# Patient Record
Sex: Male | Born: 1935 | Race: White | Hispanic: No | Marital: Single | Smoking: Former smoker
Health system: Southern US, Community
[De-identification: ages and names within clinical notes are randomized; demographics above are authoritative.]

---

## 2000-08-28 ENCOUNTER — Encounter: Payer: Self-pay | Admitting: Ophthalmology

## 2000-08-28 ENCOUNTER — Ambulatory Visit (HOSPITAL_COMMUNITY): Admission: RE | Admit: 2000-08-28 | Discharge: 2000-08-28 | Payer: Self-pay | Admitting: Ophthalmology

## 2004-10-15 ENCOUNTER — Ambulatory Visit: Admission: RE | Admit: 2004-10-15 | Discharge: 2004-12-30 | Payer: Self-pay | Admitting: *Deleted

## 2005-01-25 ENCOUNTER — Ambulatory Visit: Admission: RE | Admit: 2005-01-25 | Discharge: 2005-01-25 | Payer: Self-pay | Admitting: *Deleted

## 2010-04-14 ENCOUNTER — Inpatient Hospital Stay (HOSPITAL_COMMUNITY): Admission: RE | Admit: 2010-04-14 | Discharge: 2010-04-16 | Payer: Self-pay | Admitting: Orthopedic Surgery

## 2010-12-04 LAB — CBC
HCT: 30.4 % — ABNORMAL LOW (ref 39.0–52.0)
HCT: 31.8 % — ABNORMAL LOW (ref 39.0–52.0)
HCT: 40.5 % (ref 39.0–52.0)
Hemoglobin: 13.7 g/dL (ref 13.0–17.0)
MCH: 30.1 pg (ref 26.0–34.0)
MCH: 30.3 pg (ref 26.0–34.0)
MCHC: 34 g/dL (ref 30.0–36.0)
MCHC: 34.3 g/dL (ref 30.0–36.0)
MCHC: 34.4 g/dL (ref 30.0–36.0)
MCV: 87.8 fL (ref 78.0–100.0)
MCV: 88 fL (ref 78.0–100.0)
MCV: 88.8 fL (ref 78.0–100.0)
Platelets: 130 10*3/uL — ABNORMAL LOW (ref 150–400)
RBC: 4.56 MIL/uL (ref 4.22–5.81)
RDW: 14.4 % (ref 11.5–15.5)
RDW: 14.5 % (ref 11.5–15.5)
RDW: 14.7 % (ref 11.5–15.5)
WBC: 8.3 10*3/uL (ref 4.0–10.5)

## 2010-12-04 LAB — BASIC METABOLIC PANEL
BUN: 12 mg/dL (ref 6–23)
BUN: 9 mg/dL (ref 6–23)
CO2: 26 mEq/L (ref 19–32)
Calcium: 8.5 mg/dL (ref 8.4–10.5)
Chloride: 102 mEq/L (ref 96–112)
Chloride: 104 mEq/L (ref 96–112)
Creatinine, Ser: 0.99 mg/dL (ref 0.4–1.5)
GFR calc Af Amer: >60 mL/min (ref >60)
GFR calc non Af Amer: >60 mL/min (ref >60)
Glucose, Bld: 112 mg/dL — ABNORMAL HIGH (ref 70–99)
Glucose, Bld: 151 mg/dL — ABNORMAL HIGH (ref 70–99)
Potassium: 4 mEq/L (ref 3.5–5.1)
Potassium: 4.3 mEq/L (ref 3.5–5.1)
Sodium: 141 mEq/L (ref 135–145)

## 2010-12-04 LAB — TYPE AND SCREEN: Antibody Screen: NEGATIVE

## 2010-12-04 LAB — DIFFERENTIAL
Basophils Absolute: 0 10*3/uL (ref 0.0–0.1)
Basophils Relative: 0 % (ref 0–1)
Eosinophils Absolute: 0.1 10*3/uL (ref 0.0–0.7)
Eosinophils Relative: 1 % (ref 0–5)
Lymphocytes Relative: 15 % (ref 12–46)
Lymphs Abs: 1.2 10*3/uL (ref 0.7–4.0)
Monocytes Absolute: 0.5 10*3/uL (ref 0.1–1.0)
Monocytes Relative: 6 % (ref 3–12)
Neutro Abs: 6.4 10*3/uL (ref 1.7–7.7)
Neutrophils Relative %: 78 % — ABNORMAL HIGH (ref 43–77)

## 2010-12-04 LAB — URINALYSIS, ROUTINE W REFLEX MICROSCOPIC
Glucose, UA: NEGATIVE mg/dL
Hgb urine dipstick: NEGATIVE
Nitrite: NEGATIVE
Specific Gravity, Urine: 1.034 — ABNORMAL HIGH (ref 1.005–1.030)
pH: 5 (ref 5.0–8.0)

## 2010-12-04 LAB — BODY FLUID CULTURE: Culture: NO GROWTH

## 2010-12-04 LAB — GRAM STAIN

## 2010-12-04 LAB — ANAEROBIC CULTURE

## 2010-12-04 LAB — SURGICAL PCR SCREEN: MRSA, PCR: NEGATIVE

## 2010-12-04 LAB — PROTIME-INR
INR: 1.03 (ref 0.00–1.49)
Prothrombin Time: 13.4 seconds (ref 11.6–15.2)

## 2010-12-04 LAB — ABO/RH: ABO/RH(D): O POS

## 2011-02-04 NOTE — Op Note (Signed)
[name]  Patient:    Thomas Lewis, Thomas Lewis                         MRN: [id number] Proc. Date: 08/28/00 Adm. Date:  [id number] Attending:  Ernesto Rutherford                           Operative Report  PREOPERATIVE DIAGNOSIS:  Macular hole, left eye, stage III, idiopathic.  POSTOPERATIVE DIAGNOSES: 1. Macular hole, left eye, stage III, idiopathic. 2. Peripheral retinal hole, _____ to the macular region.  OPERATION: 1. Posterior vitrectomy and membrane peel internal limited membrane    with ICG guidance of the left eye. 2. Foveal laser photocoagulation of the left eye for retinal hole which    developed into complication of the internal limited membrane resection.  SURGEON:  Ernesto Rutherford, M.D.  ANESTHESIA:  General endotracheal anesthesia.  INDICATIONS:  The patient is a 77 year old male with profound visual loss i his left eye on the basis of stage III idiopathic macular hole.  This is an attempt to release the traction from the internal limited membrane keeping the macular hole open.  The patient understands the risks of anesthesia including the rare occurrence of death, regarding the eye, loss of the eye including hemorrhage, infection, scarring, need for other surgery, no change in vision, loss of vision, progression of disease despite intervention.  Informed signed consent was obtained.  DESCRIPTION OF PROCEDURE:  The patient was taken to the operating room.  In the operating room, general endotracheal anesthesia was instituted without difficulty.  Left ocular region was sterilely prepped and draped in the usual ophthalmic fashion.  A lid speculum was applied.  Conjunctival peritomy fashioned temporally and supranasally.  A 4 mm infusion was secured 3.5 mm posterior to the limbus in the inferior temporal quadrant.  Placement in the vitreous cavity was verified visually.  Superior sclerotomy was then placed. Placement of the infusion in the  vitreous cavity had been verified.  At this time, core vitrectomy was then begun.  Care was taken to avoid trauma to the native lens.  At this time, iatrogenic posterior vitreous detachment was developed nasal to the optic nerve and carried out anterior to the _____ _____ 360 degrees.  Vitreous skirt trimmed.  Air exchange 50% was then carried out.  ICG was then reconstituted and 0.4 cc was then mixed with 6 cc of BSS. This was then injected over the 50% air filled macular region and immediately began aspirating with passive Dorc needle.  At this time, a rice pick was then used to engage the internal limited membrane and Dorc forceps were then used to circumferentially develop a peel the internal limited membrane in a concentric fashion.  A small retinal hole developed approximately 3 disk diameters from the fovea along the temporal vascular arcade and foveal laser photocoagulation was placed around this side for peripheral axis retinal detachment and all eye ILM attachments in this area had been removed. Peripheral inspection of the retina revealed no retinal holes or tears. Fluid air exchange was then completed.  Then SF6 20% was exchanged over the lid and completed.  Superior sclerotomy was closed with 7-0 Vicryl suture. The infusion was removed and closed with 7-0 Vicryl suture.  Conjunctiva was closed with 7-0 Vicryl suture.  Subconjunctival injection of antibiotics was applied.  The patient tolerated the procedure without complications and was taken  to the recovery room in good and stable condition after awakening from anesthesia without complications. DD:  08/28/00 TD:  08/28/00 Job: [id number]

## 2012-03-07 IMAGING — CR DG PORTABLE PELVIS
1 series · 1 of 1 positions shown · non-contrast
Comparison: None.

CLINICAL DATA: Right total hip arthroplasty

PORTABLE PELVIS

[AP]
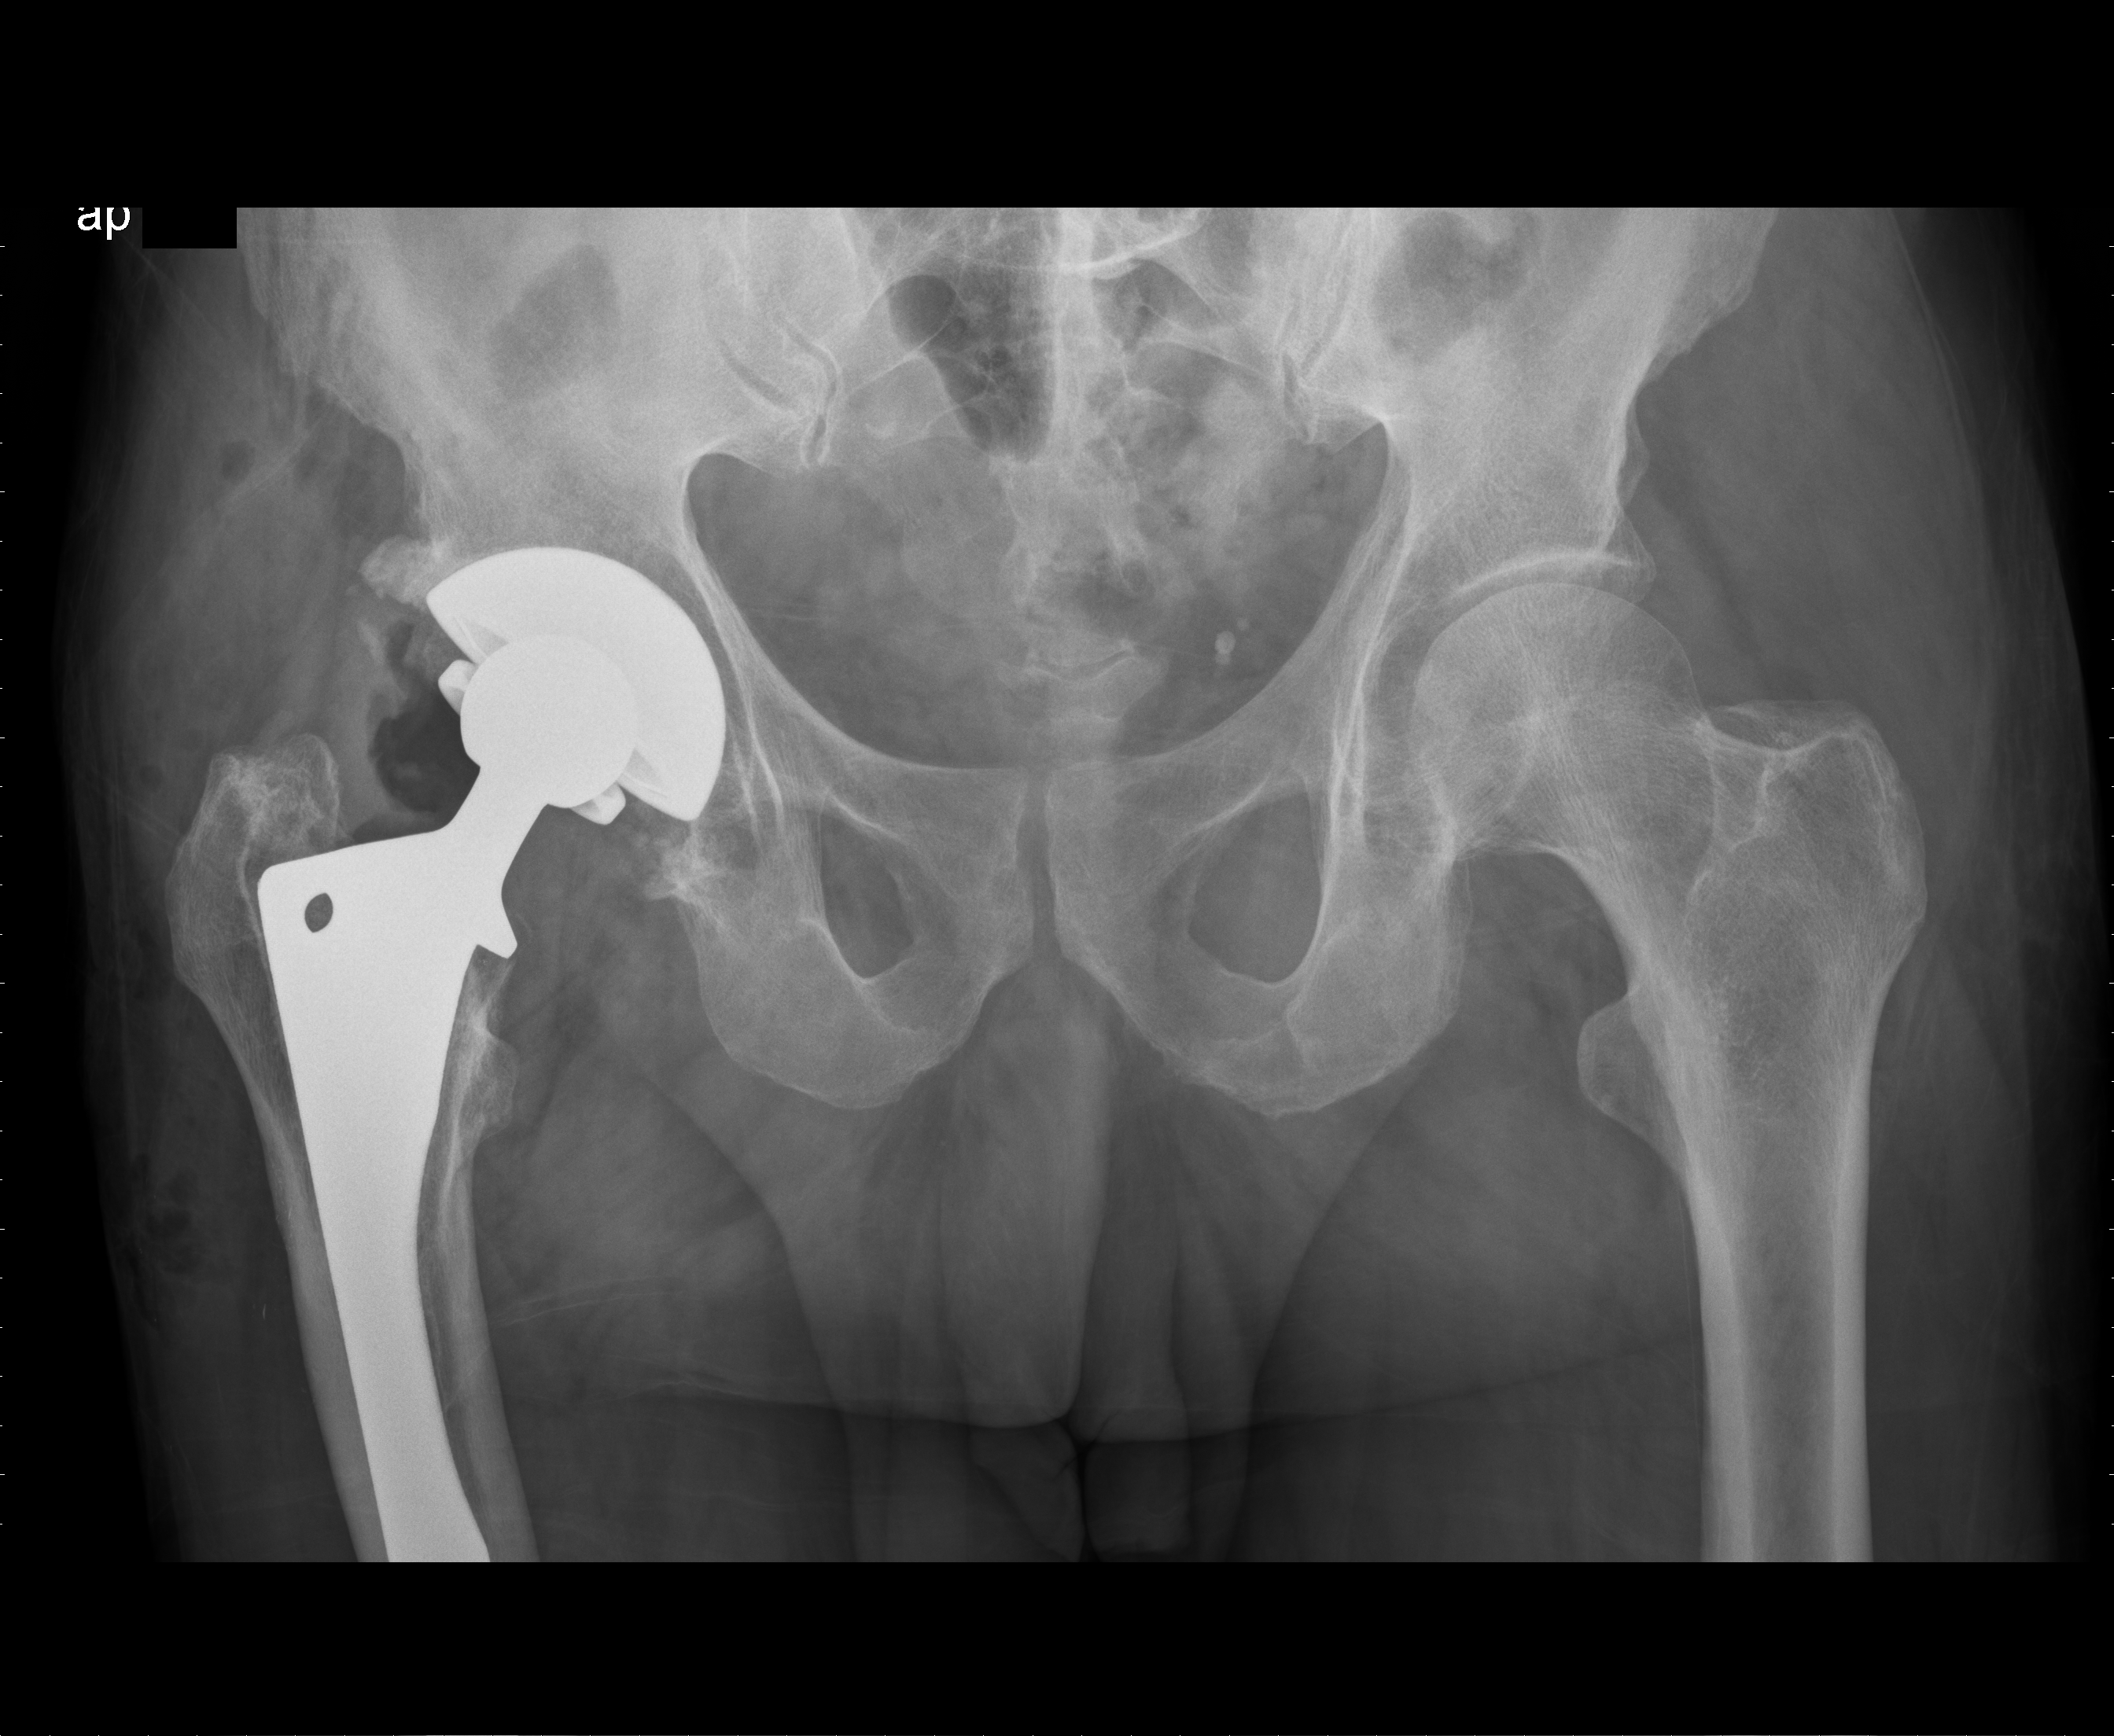

[1 of 1 positions shown; findings below may reference images not displayed]

FINDINGS: Bipolar right total hip arthroplasty has been performed.
Expected postoperative changes with air in the joint space and in
the soft tissues.  No hardware complication or acute bony
abnormality.  Visualized bony pelvis intact.
IMPRESSION: Expected postoperative appearance right total hip arthroplasty

## 2016-01-29 ENCOUNTER — Encounter: Payer: Self-pay | Admitting: Surgery

## 2016-02-05 ENCOUNTER — Encounter: Payer: Self-pay | Admitting: Surgery

## 2017-09-25 ENCOUNTER — Encounter: Payer: Self-pay | Admitting: Podiatry

## 2017-09-25 ENCOUNTER — Ambulatory Visit: Payer: Medicare HMO | Admitting: Podiatry

## 2017-09-25 VITALS — BP 133/89 | HR 78 | Ht 72.0 in | Wt 175.0 lb

## 2017-09-25 DIAGNOSIS — M659 Synovitis and tenosynovitis, unspecified: Secondary | ICD-10-CM

## 2017-09-25 DIAGNOSIS — M722 Plantar fascial fibromatosis: Secondary | ICD-10-CM

## 2017-10-18 ENCOUNTER — Encounter: Payer: Medicare HMO | Admitting: Vascular Surgery

## 2017-11-13 NOTE — Progress Notes (Signed)
  Subjective:  Patient ID: Thomas Lewis, male    DOB: 04/18/1929,  MRN: [id number]  Chief Complaint  Patient presents with  . Foot Pain    pt states left arch hurts started hurting after he ran out of prednisone for his arthtitis (carpal tunnel)    84 y.o. male presents with the above complaint. States that his L arch has been aching for a good while. Has purchased OTC orthotics which helps some. Has been using WD-40 on his arch for relief. Endorses carpal tunnel syndrome for which he takes prednisone but he has run out. States the pain started when he ran out of prednisone.  No past medical history on file.  Current Outpatient Medications:  .  ibuprofen (ADVIL,MOTRIN) 200 MG tablet, Take 200 mg by mouth every 6 (six) hours as needed., Disp: , Rfl:  .  lisinopril-hydrochlorothiazide (PRINZIDE,ZESTORETIC) 10-12.5 MG tablet, Take 1 tablet by mouth daily., Disp: , Rfl:  .  oxyCODONE-acetaminophen (PERCOCET/ROXICET) 5-325 MG tablet, , Disp: , Rfl:  .  predniSONE (DELTASONE) 5 MG tablet, TAKE AS DIRECTED FOR 12 DAYS, Disp: , Rfl: 0  No Known Allergies Review of Systems Objective:   Vitals:   09/25/17 1019  BP: 133/89  Pulse: 78   General AA&O x3. Normal mood and affect.  Vascular Dorsalis pedis and posterior tibial pulses  present 2+ bilaterally  Capillary refill normal to all digits. Pedal hair growth normal.  Neurologic Epicritic sensation grossly present.  Dermatologic No open lesions. Interspaces clear of maceration. Nails well groomed and normal in appearance.  Orthopedic: MMT 5/5 in dorsiflexion, plantarflexion, inversion, and eversion. Normal joint ROM without pain or crepitus. Pain to palpation L medial longitudinal arch.   Assessment & Plan:  Patient was evaluated and treated and all questions answered.  Plantar Fasciitis, left - XR reviewed as above.  - Educated on icing and stretching. Instructions given.  -Continue OTC orthotics. -Discussed he should see original  prescriber for refill of prednisone.  Return in about 6 weeks (around 11/06/2017) for Plantar fasciitis.

## 2020-03-10 DIAGNOSIS — Z7189 Other specified counseling: Secondary | ICD-10-CM | POA: Diagnosis not present

## 2020-03-10 DIAGNOSIS — Z0001 Encounter for general adult medical examination with abnormal findings: Secondary | ICD-10-CM | POA: Diagnosis not present

## 2020-03-10 DIAGNOSIS — Z23 Encounter for immunization: Secondary | ICD-10-CM | POA: Diagnosis not present

## 2020-03-10 DIAGNOSIS — G8929 Other chronic pain: Secondary | ICD-10-CM | POA: Diagnosis not present

## 2020-05-08 DIAGNOSIS — C44329 Squamous cell carcinoma of skin of other parts of face: Secondary | ICD-10-CM | POA: Diagnosis not present

## 2020-05-08 DIAGNOSIS — L578 Other skin changes due to chronic exposure to nonionizing radiation: Secondary | ICD-10-CM | POA: Diagnosis not present

## 2020-05-08 DIAGNOSIS — L570 Actinic keratosis: Secondary | ICD-10-CM | POA: Diagnosis not present

## 2020-05-08 DIAGNOSIS — L821 Other seborrheic keratosis: Secondary | ICD-10-CM | POA: Diagnosis not present

## 2020-06-01 DIAGNOSIS — D0439 Carcinoma in situ of skin of other parts of face: Secondary | ICD-10-CM | POA: Diagnosis not present

## 2020-06-10 DIAGNOSIS — I10 Essential (primary) hypertension: Secondary | ICD-10-CM | POA: Diagnosis not present

## 2020-06-10 DIAGNOSIS — Z79899 Other long term (current) drug therapy: Secondary | ICD-10-CM | POA: Diagnosis not present

## 2020-06-10 DIAGNOSIS — G8929 Other chronic pain: Secondary | ICD-10-CM | POA: Diagnosis not present

## 2020-06-10 DIAGNOSIS — D6489 Other specified anemias: Secondary | ICD-10-CM | POA: Diagnosis not present

## 2020-06-10 DIAGNOSIS — K5900 Constipation, unspecified: Secondary | ICD-10-CM | POA: Diagnosis not present

## 2020-09-19 DEATH — deceased
